# Patient Record
Sex: Male | Born: 1980 | Race: White | Hispanic: No | Marital: Married | Smoking: Current every day smoker
Health system: Southern US, Community
[De-identification: ages and names within clinical notes are randomized; demographics above are authoritative.]

## PROBLEM LIST (undated history)

## (undated) DIAGNOSIS — M549 Dorsalgia, unspecified: Secondary | ICD-10-CM

## (undated) HISTORY — PX: OTHER SURGICAL HISTORY: SHX169

---

## 2002-03-01 ENCOUNTER — Encounter: Payer: Self-pay | Admitting: Internal Medicine

## 2002-03-01 ENCOUNTER — Ambulatory Visit (HOSPITAL_COMMUNITY): Admission: RE | Admit: 2002-03-01 | Discharge: 2002-03-01 | Payer: Self-pay | Admitting: Internal Medicine

## 2002-11-08 ENCOUNTER — Ambulatory Visit (HOSPITAL_COMMUNITY): Admission: RE | Admit: 2002-11-08 | Discharge: 2002-11-08 | Payer: Self-pay | Admitting: *Deleted

## 2003-02-10 ENCOUNTER — Emergency Department (HOSPITAL_COMMUNITY): Admission: EM | Admit: 2003-02-10 | Discharge: 2003-02-10 | Payer: Self-pay | Admitting: Emergency Medicine

## 2003-02-20 ENCOUNTER — Ambulatory Visit (HOSPITAL_COMMUNITY): Admission: RE | Admit: 2003-02-20 | Discharge: 2003-02-20 | Payer: Self-pay | Admitting: Internal Medicine

## 2003-09-23 ENCOUNTER — Emergency Department (HOSPITAL_COMMUNITY): Admission: AC | Admit: 2003-09-23 | Discharge: 2003-09-24 | Payer: Self-pay

## 2003-10-03 ENCOUNTER — Ambulatory Visit (HOSPITAL_BASED_OUTPATIENT_CLINIC_OR_DEPARTMENT_OTHER): Admission: RE | Admit: 2003-10-03 | Discharge: 2003-10-03 | Payer: Self-pay | Admitting: Orthopedic Surgery

## 2003-10-10 ENCOUNTER — Encounter: Admission: RE | Admit: 2003-10-10 | Discharge: 2003-10-10 | Payer: Self-pay | Admitting: Orthopedic Surgery

## 2003-10-14 ENCOUNTER — Encounter (HOSPITAL_COMMUNITY): Admission: RE | Admit: 2003-10-14 | Discharge: 2003-11-13 | Payer: Self-pay | Admitting: Orthopedic Surgery

## 2003-11-20 ENCOUNTER — Encounter (HOSPITAL_COMMUNITY): Admission: RE | Admit: 2003-11-20 | Discharge: 2003-12-20 | Payer: Self-pay | Admitting: Orthopedic Surgery

## 2004-03-03 ENCOUNTER — Emergency Department (HOSPITAL_COMMUNITY): Admission: EM | Admit: 2004-03-03 | Discharge: 2004-03-03 | Payer: Self-pay | Admitting: *Deleted

## 2004-03-09 ENCOUNTER — Emergency Department (HOSPITAL_COMMUNITY): Admission: EM | Admit: 2004-03-09 | Discharge: 2004-03-09 | Payer: Self-pay | Admitting: *Deleted

## 2004-05-07 ENCOUNTER — Emergency Department (HOSPITAL_COMMUNITY): Admission: EM | Admit: 2004-05-07 | Discharge: 2004-05-07 | Payer: Self-pay | Admitting: Emergency Medicine

## 2004-08-23 ENCOUNTER — Emergency Department (HOSPITAL_COMMUNITY): Admission: EM | Admit: 2004-08-23 | Discharge: 2004-08-23 | Payer: Self-pay | Admitting: Emergency Medicine

## 2005-04-18 ENCOUNTER — Emergency Department (HOSPITAL_COMMUNITY): Admission: EM | Admit: 2005-04-18 | Discharge: 2005-04-19 | Payer: Self-pay | Admitting: Emergency Medicine

## 2005-06-07 ENCOUNTER — Emergency Department (HOSPITAL_COMMUNITY): Admission: EM | Admit: 2005-06-07 | Discharge: 2005-06-08 | Payer: Self-pay | Admitting: Emergency Medicine

## 2005-07-05 ENCOUNTER — Emergency Department (HOSPITAL_COMMUNITY): Admission: EM | Admit: 2005-07-05 | Discharge: 2005-07-05 | Payer: Self-pay | Admitting: Emergency Medicine

## 2007-02-06 ENCOUNTER — Emergency Department (HOSPITAL_COMMUNITY): Admission: EM | Admit: 2007-02-06 | Discharge: 2007-02-06 | Payer: Self-pay | Admitting: Emergency Medicine

## 2007-09-09 ENCOUNTER — Emergency Department (HOSPITAL_COMMUNITY): Admission: EM | Admit: 2007-09-09 | Discharge: 2007-09-09 | Payer: Self-pay | Admitting: Emergency Medicine
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
## 2010-10-07 ENCOUNTER — Emergency Department (HOSPITAL_COMMUNITY): Payer: Self-pay

## 2010-10-07 ENCOUNTER — Encounter: Payer: Self-pay | Admitting: *Deleted

## 2010-10-07 ENCOUNTER — Emergency Department (HOSPITAL_COMMUNITY)
Admission: EM | Admit: 2010-10-07 | Discharge: 2010-10-07 | Disposition: A | Discharge status: Home / Self Care | Payer: Self-pay | Attending: Emergency Medicine | Admitting: Emergency Medicine

## 2010-10-07 DIAGNOSIS — F17200 Nicotine dependence, unspecified, uncomplicated: Secondary | ICD-10-CM | POA: Insufficient documentation

## 2010-10-07 DIAGNOSIS — M25561 Pain in right knee: Secondary | ICD-10-CM

## 2010-10-07 DIAGNOSIS — M25569 Pain in unspecified knee: Secondary | ICD-10-CM | POA: Insufficient documentation

## 2010-10-07 MED ORDER — KETOROLAC TROMETHAMINE 60 MG/2ML IM SOLN
60.0000 mg | Freq: Once | INTRAMUSCULAR | Status: AC
  Administered 2010-10-07: 60 mg via INTRAMUSCULAR
  Filled 2010-10-07: qty 2

## 2010-10-07 MED ORDER — NAPROXEN 500 MG PO TABS: 500.0000 mg | ORAL_TABLET | Freq: Two times a day (BID) | ORAL | Status: AC

## 2010-10-07 NOTE — ED Provider Notes (Signed)
History     CSN: [id number] Arrival date & time: 10/07/2010 12:45 AM  Chief Complaint  Patient presents with  . Knee Pain    (Consider location/radiation/quality/duration/timing/severity/associated sxs/prior treatment) HPI Comments: Pt has hx of R knee pain - acute in onset when he awoke from nap today - states that his pain is constant, mild, worse with movement of the knee,  He has no hx of trauma, no swelling, no redness and no fever, He has hx of fracture of the leg in the past but this was many years.  He has had no meds pta.  Patient is a 30 y.o. male presenting with knee pain. The history is provided by the patient and a significant other.  Knee Pain    History reviewed. No pertinent past medical history.  Past Surgical History  Procedure Date  . Wrist surgery     History reviewed. No pertinent family history.  History  Substance Use Topics  . Smoking status: Current Everyday Smoker -- 0.5 packs/day    Types: Cigarettes  . Smokeless tobacco: Not on file  . Alcohol Use: Yes     occ. use      Review of Systems  Constitutional: Negative for fever.  Gastrointestinal: Negative for nausea and vomiting.  Musculoskeletal: Negative for myalgias, back pain and joint swelling.  Neurological: Negative for weakness and numbness.  Hematological: Does not bruise/bleed easily.    Allergies  Review of patient's allergies indicates no known allergies.  Home Medications   Current Outpatient Rx  Name Route Sig Dispense Refill  . IBUPROFEN 800 MG PO TABS Oral Take 800 mg by mouth every 8 (eight) hours as needed.        BP 137/86  Pulse 77  Temp(Src) 98 F (36.7 C) (Oral)  Resp 18  Ht 6\' 3"  (1.905 m)  Wt 225 lb (102.059 kg)  BMI 28.12 kg/m2  SpO2 99%  Physical Exam  Nursing note and vitals reviewed. Constitutional: He appears well-developed and well-nourished. No distress.  HENT:  Head: Normocephalic and atraumatic.  Mouth/Throat: Oropharynx is clear and moist.  No oropharyngeal exudate.  Eyes: Conjunctivae and EOM are normal. Pupils are equal, round, and reactive to light. Right eye exhibits no discharge. Left eye exhibits no discharge. No scleral icterus.  Neck: Normal range of motion. Neck supple. No JVD present. No thyromegaly present.  Cardiovascular: Normal rate, regular rhythm, normal heart sounds and intact distal pulses.  Exam reveals no gallop and no friction rub.   No murmur heard. Pulmonary/Chest: Effort normal and breath sounds normal. No respiratory distress. He has no wheezes. He has no rales.  Abdominal: Soft. Bowel sounds are normal. He exhibits no distension and no mass. There is no tenderness.  Musculoskeletal: Normal range of motion. He exhibits tenderness (R knee with no palpable ttp but with some pain in the internal lateral knee, no swelling, no rendess, no crepitance.). He exhibits no edema.  Lymphadenopathy:    He has no cervical adenopathy.  Neurological: He is alert. Coordination normal.  Skin: Skin is warm and dry. No rash noted. No erythema.  Psychiatric: He has a normal mood and affect. His behavior is normal.    ED Course  Procedures (including critical care time)  Labs Reviewed - No data to display Dg Knee Complete 4 Views Right  10/07/2010  *RADIOLOGY REPORT*  Clinical Data: Patient woke up with severe right knee pain.  No known injury.  RIGHT KNEE - COMPLETE 4+ VIEW  Comparison: 02/10/2003  Findings:  The right knee appears intact. No evidence of acute fracture or subluxation.  No focal bone lesions.  Bone matrix and cortex appear intact.  No abnormal radiopaque densities in the soft tissues.  No focal cortical destruction.  No significant effusion. No significant change since the prior study.  IMPRESSION: No acute bony abnormalities demonstrated.  Original Report Authenticated By: Marlon Pel, M.D.     No diagnosis found.    MDM  Imaging neg for frx, RICE therapy started, explains pain is in distribution  of the IT band.  Toradol given.  F/u encouraged.        Vida Roller, MD 10/07/10 [phone]

## 2010-10-07 NOTE — ED Notes (Signed)
Woke up yesterday evening at 1900 and with left knee pain, denies any injury

## 2010-12-09 ENCOUNTER — Encounter (HOSPITAL_COMMUNITY): Payer: Self-pay | Admitting: *Deleted

## 2010-12-09 ENCOUNTER — Emergency Department (HOSPITAL_COMMUNITY)
Admission: EM | Admit: 2010-12-09 | Discharge: 2010-12-09 | Disposition: A | Discharge status: Home / Self Care | Payer: Self-pay | Attending: Emergency Medicine | Admitting: Emergency Medicine

## 2010-12-09 DIAGNOSIS — G5600 Carpal tunnel syndrome, unspecified upper limb: Secondary | ICD-10-CM | POA: Insufficient documentation

## 2010-12-09 DIAGNOSIS — F17200 Nicotine dependence, unspecified, uncomplicated: Secondary | ICD-10-CM | POA: Insufficient documentation

## 2010-12-09 DIAGNOSIS — G5602 Carpal tunnel syndrome, left upper limb: Secondary | ICD-10-CM

## 2010-12-09 MED ORDER — PREDNISONE 10 MG PO TABS: ORAL_TABLET | ORAL | Status: AC

## 2010-12-09 MED ORDER — TRAMADOL HCL 50 MG PO TABS: 50.0000 mg | ORAL_TABLET | Freq: Four times a day (QID) | ORAL | Status: AC | PRN

## 2010-12-09 MED ORDER — TRAMADOL HCL 50 MG PO TABS
50.0000 mg | ORAL_TABLET | Freq: Once | ORAL | Status: AC
  Administered 2010-12-09: 50 mg via ORAL
  Filled 2010-12-09: qty 1

## 2010-12-09 MED ORDER — PREDNISONE 20 MG PO TABS
60.0000 mg | ORAL_TABLET | Freq: Once | ORAL | Status: AC
  Administered 2010-12-09: 60 mg via ORAL
  Filled 2010-12-09: qty 3

## 2010-12-09 NOTE — ED Provider Notes (Signed)
History     CSN: [id number] Arrival date & time: 12/09/2010 10:46 AM   First MD Initiated Contact with Patient 12/09/10 1108      Chief Complaint  Patient presents with  . Numbness    left hand    (Consider location/radiation/quality/duration/timing/severity/associated sxs/prior treatment) Patient is a 30 y.o. male presenting with wrist pain. The history is provided by the patient.  Wrist Pain This is a recurrent problem. The current episode started in the past 7 days. The problem occurs constantly. The problem has been waxing and waning. Pertinent negatives include no abdominal pain, arthralgias, chest pain, congestion, fever, headaches, joint swelling, myalgias, nausea, neck pain, numbness, rash, sore throat or weakness. Associated symptoms comments: He reports numbness in his left 1st,  Second and third fingers which radiates to his mid forearm for 2 days.  Has a history of carpal tunnel of his right wrist,  And feels like the same sensation in the left wrist.  He denies weakness,  Denies  Injury.  Symptoms are worse at night and kept him from sleeping last night.  He works as a Nature conservation officer at [employer].  He is right handed.. Exacerbated by: Certain positiions make worse,  esp flexion of the wrist. He has tried NSAIDs and ice for the symptoms. The treatment provided no relief.    History reviewed. No pertinent past medical history.  Past Surgical History  Procedure Date  . Wrist surgery     History reviewed. No pertinent family history.  History  Substance Use Topics  . Smoking status: Current Everyday Smoker -- 0.5 packs/day    Types: Cigarettes  . Smokeless tobacco: Not on file  . Alcohol Use: Yes     occ. use      Review of Systems  Constitutional: Negative for fever.  HENT: Negative for congestion, sore throat and neck pain.   Eyes: Negative.   Respiratory: Negative for chest tightness and shortness of breath.   Cardiovascular: Negative for chest pain.    Gastrointestinal: Negative for nausea and abdominal pain.  Genitourinary: Negative.   Musculoskeletal: Negative for myalgias, joint swelling and arthralgias.  Skin: Negative.  Negative for rash and wound.  Neurological: Negative for dizziness, weakness, light-headedness, numbness and headaches.  Hematological: Negative.   Psychiatric/Behavioral: Negative.     Allergies  Review of patient's allergies indicates no known allergies.  Home Medications   Current Outpatient Rx  Name Route Sig Dispense Refill  . IBUPROFEN 800 MG PO TABS Oral Take 800 mg by mouth every 8 (eight) hours as needed.      Marland Kitchen NAPROXEN 500 MG PO TABS Oral Take 1 tablet (500 mg total) by mouth 2 (two) times daily. 30 tablet 0    BP 123/75  Pulse 65  Temp(Src) 97.7 F (36.5 C) (Oral)  Resp 18  Ht 6\' 3"  (1.905 m)  Wt 225 lb (102.059 kg)  BMI 28.12 kg/m2  SpO2 98%  Physical Exam  Nursing note and vitals reviewed. Constitutional: He is oriented to person, place, and time. He appears well-developed and well-nourished.  HENT:  Head: Normocephalic and atraumatic.  Eyes: Conjunctivae are normal.  Neck: Normal range of motion.  Cardiovascular: Normal rate and intact distal pulses.   Pulmonary/Chest: Effort normal.  Abdominal: He exhibits no distension.  Musculoskeletal: Normal range of motion. He exhibits no edema and no tenderness.       Left wrist: He exhibits no swelling, no effusion and no deformity.       Positive Tinels  sign. Full strength and ROM of digits and left wrist.    Neurological: He is alert and oriented to person, place, and time.  Skin: Skin is warm and dry.  Psychiatric: He has a normal mood and affect.    ED Course  Procedures (including critical care time)  Labs Reviewed - No data to display No results found.   No diagnosis found.    MDM  Carpel tunnel.  Velcro splint,  Tramadol,  Prednisone taper.  Referral to Dr Nyoka Cowden, PA 12/09/10 [phone]

## 2010-12-09 NOTE — ED Notes (Signed)
Pt c/o numbness to left arm x 2 days; pt states he thinks it is his carpel tunnel

## 2010-12-09 NOTE — ED Notes (Signed)
Pt a/ox4. resp even and unlabored. NAD at this time. D/C instructions reviewed with pt. Pt verbalized understanding. Pt ambulated to lobby with steady gate.  

## 2010-12-10 NOTE — ED Provider Notes (Signed)
Medical screening examination/treatment/procedure(s) were performed by non-physician practitioner and as supervising physician I was immediately available for consultation/collaboration.  Donnetta Hutching, MD 12/10/10 [phone]

## 2010-12-21 ENCOUNTER — Encounter (HOSPITAL_COMMUNITY): Payer: Self-pay | Admitting: *Deleted

## 2010-12-21 ENCOUNTER — Emergency Department (HOSPITAL_COMMUNITY)
Admission: EM | Admit: 2010-12-21 | Discharge: 2010-12-21 | Disposition: A | Discharge status: Home / Self Care | Payer: Self-pay | Attending: Emergency Medicine | Admitting: Emergency Medicine

## 2010-12-21 DIAGNOSIS — F17200 Nicotine dependence, unspecified, uncomplicated: Secondary | ICD-10-CM | POA: Insufficient documentation

## 2010-12-21 DIAGNOSIS — R509 Fever, unspecified: Secondary | ICD-10-CM | POA: Insufficient documentation

## 2010-12-21 DIAGNOSIS — J111 Influenza due to unidentified influenza virus with other respiratory manifestations: Secondary | ICD-10-CM | POA: Insufficient documentation

## 2010-12-21 MED ORDER — OSELTAMIVIR PHOSPHATE 75 MG PO CAPS: 75.0000 mg | ORAL_CAPSULE | Freq: Two times a day (BID) | ORAL | Status: AC

## 2010-12-21 NOTE — ED Notes (Signed)
Pt took multi symptom cold reliever at 2 pm.

## 2010-12-21 NOTE — ED Provider Notes (Signed)
History     CSN: [id number] Arrival date & time: 12/21/2010  4:58 PM   First MD Initiated Contact with Patient 12/21/10 1719      Chief Complaint  Patient presents with  . Fever    (Consider location/radiation/quality/duration/timing/severity/associated sxs/prior treatment) HPI Comments: Tmax 107.5.  Mild cough.  Child has had flu recently.  Has not had flu vaccine.  Patient is a 30 y.o. male presenting with fever. The history is provided by the patient. No language interpreter was used.  Fever Primary symptoms of the febrile illness include fever, fatigue, headaches, cough, myalgias and arthralgias. This is a new problem.    History reviewed. No pertinent past medical history.  Past Surgical History  Procedure Date  . Wrist surgery     History reviewed. No pertinent family history.  History  Substance Use Topics  . Smoking status: Current Everyday Smoker -- 0.5 packs/day    Types: Cigarettes  . Smokeless tobacco: Not on file  . Alcohol Use: Yes     occ. use      Review of Systems  Constitutional: Positive for fever, chills and fatigue.  HENT: Positive for sore throat.   Respiratory: Positive for cough.   Musculoskeletal: Positive for myalgias and arthralgias.  Neurological: Positive for headaches.  All other systems reviewed and are negative.    Allergies  Review of patient's allergies indicates no known allergies.  Home Medications   Current Outpatient Rx  Name Route Sig Dispense Refill  . IBUPROFEN 800 MG PO TABS Oral Take 800 mg by mouth every 8 (eight) hours as needed.      Marland Kitchen NAPROXEN 500 MG PO TABS Oral Take 1 tablet (500 mg total) by mouth 2 (two) times daily. 30 tablet 0  . PREDNISONE 10 MG PO TABS  Take 6 tabs daily by mouth for 1 day,  Then 5 tabs daily for 1 day,  4 tabs daily for1 day,  3 tabs daily for1 day,  2 tabs daily for1 day,  Then 1 tab daily for 1 day.  21 tablet 0    BP 121/72  Pulse 126  Temp(Src) 100.6 F (38.1 C) (Oral)   Resp 22  Ht 6\' 3"  (1.905 m)  Wt 225 lb (102.059 kg)  BMI 28.12 kg/m2  SpO2 98%  Physical Exam  Nursing note and vitals reviewed. Constitutional: He is oriented to person, place, and time. He appears well-developed and well-nourished. No distress.  HENT:  Head: Normocephalic and atraumatic.  Right Ear: External ear normal.  Nose: Nose normal.  Mouth/Throat: Oropharynx is clear and moist. No oropharyngeal exudate.  Eyes: EOM are normal.  Neck: Normal range of motion.  Cardiovascular: Regular rhythm, normal heart sounds and intact distal pulses.  Exam reveals no gallop and no friction rub.   No murmur heard. Pulmonary/Chest: Effort normal and breath sounds normal. No accessory muscle usage. Not tachypneic. No respiratory distress. He has no decreased breath sounds. He has no wheezes. He has no rhonchi. He has no rales. He exhibits no tenderness.  Abdominal: Soft. He exhibits no distension. There is no tenderness.  Musculoskeletal: Normal range of motion.  Lymphadenopathy:    He has no cervical adenopathy.  Neurological: He is alert and oriented to person, place, and time.  Skin: Skin is warm and dry. He is not diaphoretic.  Psychiatric: He has a normal mood and affect. Judgment normal.    ED Course  Procedures (including critical care time)  Labs Reviewed - No data to display No  results found.   No diagnosis found.    MDM          Worthy Rancher, PA 12/21/10 [phone]

## 2010-12-21 NOTE — ED Notes (Signed)
Pt c/o fever since yesterday. Pt states that it was 107.6 at home. Pt also c/o aching all over. Pt states that he had diarrhea yesterday. Denies nausea and vomiting.

## 2010-12-22 NOTE — ED Provider Notes (Signed)
Medical screening examination/treatment/procedure(s) were performed by non-physician practitioner and as supervising physician I was immediately available for consultation/collaboration.  Nicoletta Dress. Colon Branch, MD 12/22/10 1252

## 2011-02-16 ENCOUNTER — Emergency Department (HOSPITAL_COMMUNITY): Payer: Self-pay

## 2011-02-16 ENCOUNTER — Emergency Department (HOSPITAL_COMMUNITY)
Admission: EM | Admit: 2011-02-16 | Discharge: 2011-02-16 | Disposition: A | Discharge status: Home / Self Care | Payer: Self-pay | Attending: Emergency Medicine | Admitting: Emergency Medicine

## 2011-02-16 ENCOUNTER — Encounter (HOSPITAL_COMMUNITY): Payer: Self-pay | Admitting: Emergency Medicine

## 2011-02-16 DIAGNOSIS — R110 Nausea: Secondary | ICD-10-CM | POA: Insufficient documentation

## 2011-02-16 DIAGNOSIS — R0602 Shortness of breath: Secondary | ICD-10-CM | POA: Insufficient documentation

## 2011-02-16 DIAGNOSIS — J189 Pneumonia, unspecified organism: Secondary | ICD-10-CM | POA: Insufficient documentation

## 2011-02-16 DIAGNOSIS — R05 Cough: Secondary | ICD-10-CM | POA: Insufficient documentation

## 2011-02-16 DIAGNOSIS — R509 Fever, unspecified: Secondary | ICD-10-CM | POA: Insufficient documentation

## 2011-02-16 DIAGNOSIS — R062 Wheezing: Secondary | ICD-10-CM | POA: Insufficient documentation

## 2011-02-16 DIAGNOSIS — IMO0001 Reserved for inherently not codable concepts without codable children: Secondary | ICD-10-CM | POA: Insufficient documentation

## 2011-02-16 DIAGNOSIS — R079 Chest pain, unspecified: Secondary | ICD-10-CM | POA: Insufficient documentation

## 2011-02-16 DIAGNOSIS — R059 Cough, unspecified: Secondary | ICD-10-CM | POA: Insufficient documentation

## 2011-02-16 DIAGNOSIS — R0989 Other specified symptoms and signs involving the circulatory and respiratory systems: Secondary | ICD-10-CM | POA: Insufficient documentation

## 2011-02-16 DIAGNOSIS — R5381 Other malaise: Secondary | ICD-10-CM | POA: Insufficient documentation

## 2011-02-16 MED ORDER — SODIUM CHLORIDE 0.9 % IV BOLUS (SEPSIS)
1000.0000 mL | Freq: Once | INTRAVENOUS | Status: AC
  Administered 2011-02-16: 1000 mL via INTRAVENOUS

## 2011-02-16 MED ORDER — DOXYCYCLINE HYCLATE 100 MG PO CAPS: 100.0000 mg | ORAL_CAPSULE | Freq: Two times a day (BID) | ORAL | Status: AC

## 2011-02-16 MED ORDER — DOXYCYCLINE HYCLATE 100 MG PO TABS
100.0000 mg | ORAL_TABLET | Freq: Once | ORAL | Status: AC
  Administered 2011-02-16: 100 mg via ORAL
  Filled 2011-02-16: qty 1

## 2011-02-16 MED ORDER — OXYCODONE-ACETAMINOPHEN 5-325 MG PO TABS: 1.0000 | ORAL_TABLET | Freq: Four times a day (QID) | ORAL | Status: AC | PRN

## 2011-02-16 MED ORDER — PROMETHAZINE HCL 25 MG PO TABS: 25.0000 mg | ORAL_TABLET | Freq: Four times a day (QID) | ORAL | Status: AC | PRN

## 2011-02-16 MED ORDER — ALBUTEROL SULFATE HFA 108 (90 BASE) MCG/ACT IN AERS
2.0000 | INHALATION_SPRAY | RESPIRATORY_TRACT | Status: DC | PRN
  Administered 2011-02-16: 2 via RESPIRATORY_TRACT
  Filled 2011-02-16: qty 6.7

## 2011-02-16 MED ORDER — OXYCODONE-ACETAMINOPHEN 5-325 MG PO TABS
1.0000 | ORAL_TABLET | Freq: Once | ORAL | Status: AC
Start: 2011-02-16 — End: 2011-02-16
  Administered 2011-02-16: 1 via ORAL
  Filled 2011-02-16: qty 1

## 2011-02-16 MED ORDER — ONDANSETRON HCL 4 MG/2ML IJ SOLN
4.0000 mg | Freq: Once | INTRAMUSCULAR | Status: AC
  Administered 2011-02-16: 4 mg via INTRAVENOUS
  Filled 2011-02-16: qty 2

## 2011-02-16 MED ORDER — MORPHINE SULFATE 4 MG/ML IJ SOLN
4.0000 mg | Freq: Once | INTRAMUSCULAR | Status: AC
  Administered 2011-02-16: 4 mg via INTRAVENOUS
  Filled 2011-02-16: qty 1

## 2011-02-16 NOTE — ED Notes (Signed)
Pt in X ray

## 2011-02-16 NOTE — ED Provider Notes (Signed)
History     CSN: [id number]  Arrival date & time 02/16/11  4098   First MD Initiated Contact with Patient 02/16/11 1300      Chief Complaint  Patient presents with  . Cough  . Fever    (Consider location/radiation/quality/duration/timing/severity/associated sxs/prior treatment) Patient is a 31 y.o. male presenting with cough and fever. The history is provided by the patient.  Cough This is a new problem. Associated symptoms include chest pain, myalgias, shortness of breath and wheezing. Pertinent negatives include no headaches.  Fever Primary symptoms of the febrile illness include fever, fatigue, cough, wheezing, shortness of breath, nausea and myalgias. Primary symptoms do not include headaches, abdominal pain, vomiting, diarrhea or rash.  The myalgias are not associated with weakness.   patient had cough and fevers with chest congestion for last 3 days. 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 He states he aches all over. Some nausea without vomiting. No clear sick contacts. He smokes about half-pack cigarettes a day. No diarrhea. No clear sick contacts. He states he feels weak.  History reviewed. No pertinent past medical history.  Past Surgical History  Procedure Date  . Wrist surgery     History reviewed. No pertinent family history.  History  Substance Use Topics  . Smoking status: Current Everyday Smoker -- 0.5 packs/day    Types: Cigarettes  . Smokeless tobacco: Not on file  . Alcohol Use: Yes     occ. use      Review of Systems  Constitutional: Positive for fever and fatigue. Negative for activity change and appetite change.  HENT: Negative for neck stiffness.   Eyes: Negative for pain.  Respiratory: Positive for cough, shortness of breath and wheezing. Negative for chest tightness.   Cardiovascular: Positive for chest pain. Negative for leg swelling.  Gastrointestinal: Positive for nausea. Negative for vomiting, abdominal pain and diarrhea.  Genitourinary: Negative  for flank pain.  Musculoskeletal: Positive for myalgias. Negative for back pain.  Skin: Negative for rash.  Neurological: Negative for weakness, numbness and headaches.  Psychiatric/Behavioral: Negative for behavioral problems.    Allergies  Review of patient's allergies indicates no known allergies.  Home Medications   Current Outpatient Rx  Name Route Sig Dispense Refill  . DOXYCYCLINE HYCLATE 100 MG PO CAPS Oral Take 1 capsule (100 mg total) by mouth 2 (two) times daily. 20 capsule 0  . IBUPROFEN 800 MG PO TABS Oral Take 800 mg by mouth every 8 (eight) hours as needed.      Marland Kitchen NAPROXEN 500 MG PO TABS Oral Take 1 tablet (500 mg total) by mouth 2 (two) times daily. 30 tablet 0  . OXYCODONE-ACETAMINOPHEN 5-325 MG PO TABS Oral Take 1-2 tablets by mouth every 6 (six) hours as needed for pain. 20 tablet 0  . PREDNISONE 10 MG PO TABS  Take 6 tabs daily by mouth for 1 day,  Then 5 tabs daily for 1 day,  4 tabs daily for1 day,  3 tabs daily for1 day,  2 tabs daily for1 day,  Then 1 tab daily for 1 day.  21 tablet 0  . PROMETHAZINE HCL 25 MG PO TABS Oral Take 1 tablet (25 mg total) by mouth every 6 (six) hours as needed for nausea. 20 tablet 0    BP 102/45  Pulse 100  Temp(Src) 99.7 F (37.6 C) (Oral)  Resp 22  Ht 6\' 3"  (1.905 m)  Wt 220 lb (99.791 kg)  BMI 27.50 kg/m2  SpO2 96%  Physical Exam  Nursing  note and vitals reviewed. Constitutional: He is oriented to person, place, and time. He appears well-developed and well-nourished.       Patient feels febrile. He appears uncomfortable  HENT:  Head: Normocephalic and atraumatic.  Eyes: EOM are normal. Pupils are equal, round, and reactive to light.  Neck: Normal range of motion. Neck supple.  Cardiovascular: Normal rate, regular rhythm and normal heart sounds.   No murmur heard. Pulmonary/Chest: Effort normal. He has wheezes.       Increased wheezes on right side with few rales compared to left.  Abdominal: Soft. Bowel sounds are  normal. He exhibits no distension and no mass. There is no tenderness. There is no rebound and no guarding.  Musculoskeletal: Normal range of motion. He exhibits no edema.  Neurological: He is alert and oriented to person, place, and time. No cranial nerve deficit.  Skin: Skin is warm and dry.  Psychiatric: He has a normal mood and affect.    ED Course  Procedures (including critical care time)  Labs Reviewed - No data to display Dg Chest 2 View  02/16/2011  *RADIOLOGY REPORT*  Clinical Data: Shortness of breath, nausea and vomiting.  CHEST - 2 VIEW  Comparison: None.  Findings: Trachea is midline.  Heart size normal.  Lungs are clear. No pleural fluid.  IMPRESSION: No acute findings.  Original Report Authenticated By: Reyes Ivan, M.D.     1. CAP (community acquired pneumonia)       MDM  Patient with pneumonia symptoms. Localized finding on physical examination. She be treated with antibiotics. On the ER he had some vomiting. Is given a fluid bolus and states he feels somewhat better. Discharge with antibiotics pain medications and antiemetics. He was given doxycycline due to cost.        Juliet Rude. Rubin Payor, MD 02/16/11 1544

## 2011-02-16 NOTE — ED Notes (Signed)
Patient states he does not need anything at this time. Fluid is empty in the IV bag RN Cindy aware.

## 2011-02-16 NOTE — Discharge Instructions (Signed)

## 2011-02-16 NOTE — ED Notes (Signed)
Pt c/o cough/congestion/fever x 3 days

## 2012-08-29 IMAGING — CR DG CHEST 2V
3 series · 3 of 3 positions shown · non-contrast
Comparison: None.

CLINICAL DATA: Shortness of breath, nausea and vomiting.

CHEST - 2 VIEW

[view not recorded (1 of 3)]
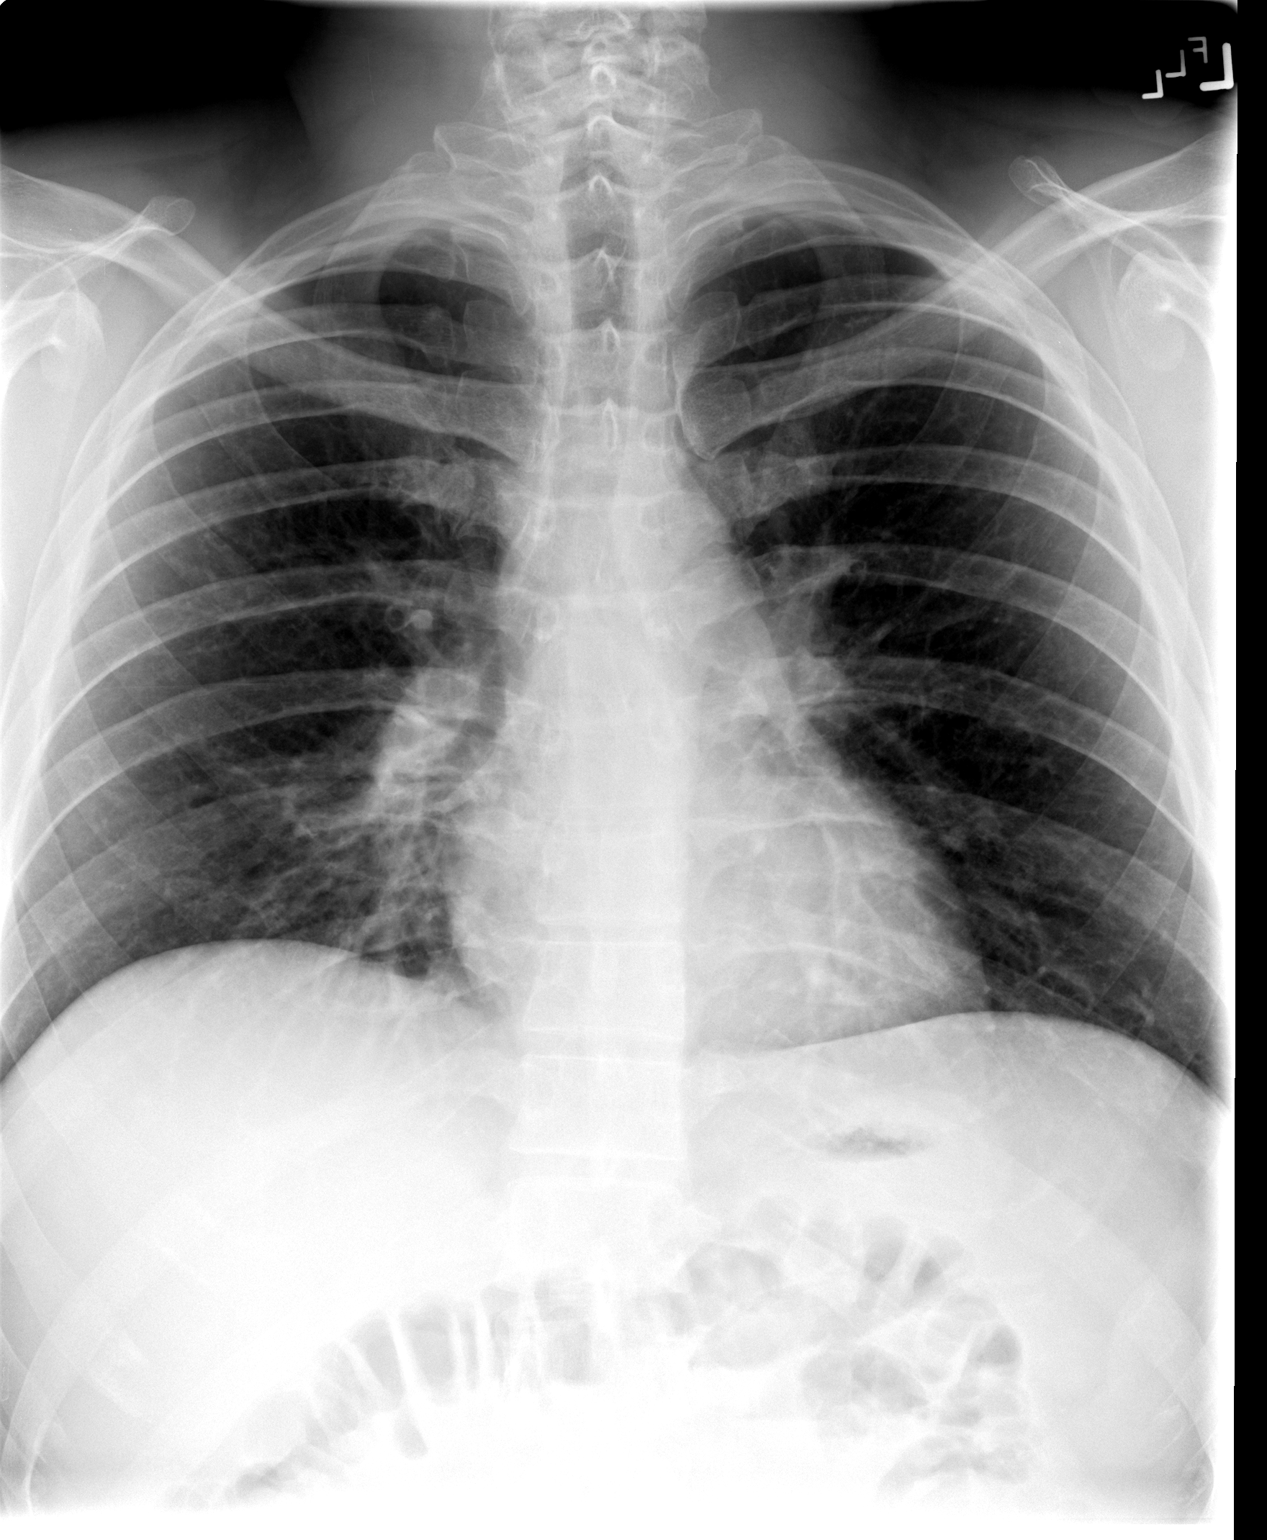

[view not recorded (2 of 3)]
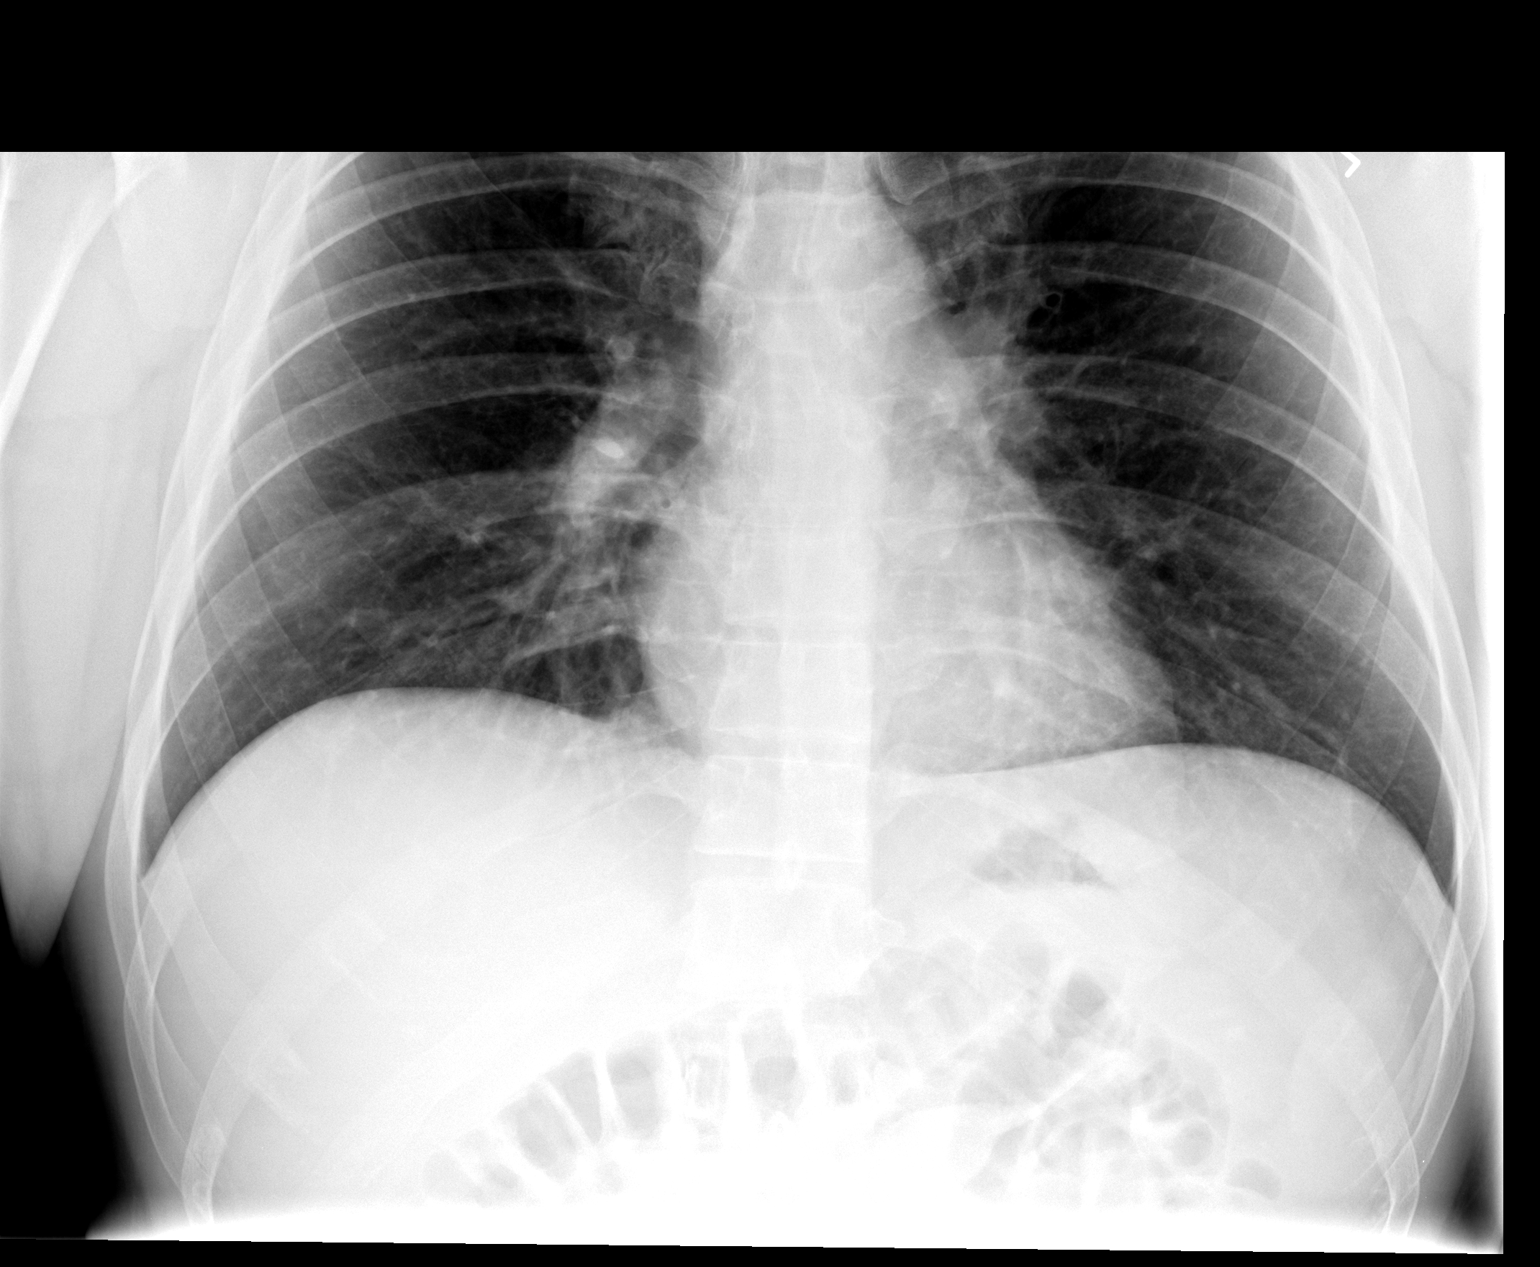

[view not recorded (3 of 3)]
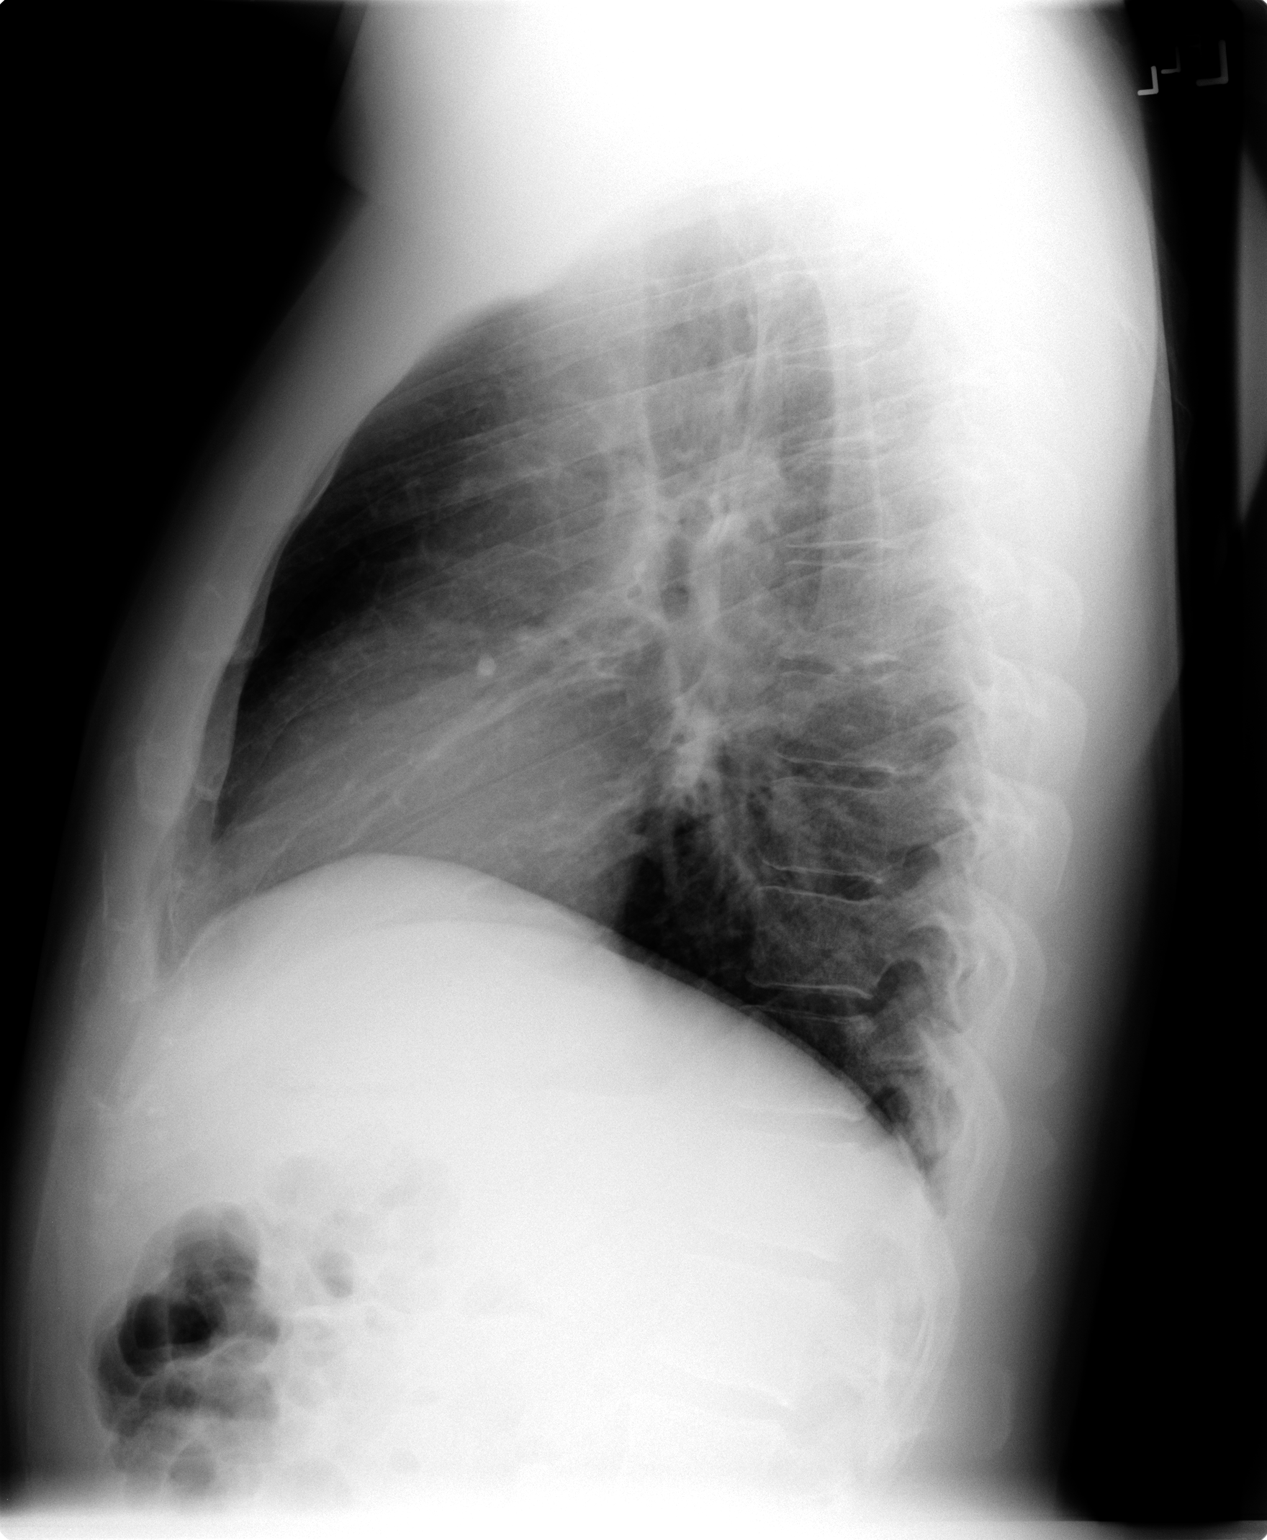

[3 of 3 positions shown; findings below may reference images not displayed]

FINDINGS: Trachea is midline.  Heart size normal.  Lungs are clear.
No pleural fluid.
IMPRESSION: No acute findings.

## 2013-05-04 ENCOUNTER — Encounter (HOSPITAL_COMMUNITY): Payer: Self-pay | Admitting: Emergency Medicine

## 2013-05-04 ENCOUNTER — Emergency Department (HOSPITAL_COMMUNITY)
Admission: EM | Admit: 2013-05-04 | Discharge: 2013-05-04 | Disposition: A | Discharge status: Home / Self Care | Payer: 59 | Attending: Emergency Medicine | Admitting: Emergency Medicine

## 2013-05-04 DIAGNOSIS — M542 Cervicalgia: Secondary | ICD-10-CM | POA: Insufficient documentation

## 2013-05-04 DIAGNOSIS — R599 Enlarged lymph nodes, unspecified: Secondary | ICD-10-CM

## 2013-05-04 DIAGNOSIS — H9209 Otalgia, unspecified ear: Secondary | ICD-10-CM | POA: Insufficient documentation

## 2013-05-04 DIAGNOSIS — Z791 Long term (current) use of non-steroidal anti-inflammatories (NSAID): Secondary | ICD-10-CM | POA: Insufficient documentation

## 2013-05-04 DIAGNOSIS — IMO0002 Reserved for concepts with insufficient information to code with codable children: Secondary | ICD-10-CM | POA: Insufficient documentation

## 2013-05-04 DIAGNOSIS — F17200 Nicotine dependence, unspecified, uncomplicated: Secondary | ICD-10-CM | POA: Insufficient documentation

## 2013-05-04 HISTORY — DX: Dorsalgia, unspecified: M54.9

## 2013-05-04 MED ORDER — AMOXICILLIN 500 MG PO CAPS: 500.0000 mg | ORAL_CAPSULE | Freq: Three times a day (TID) | ORAL | Status: AC

## 2013-05-04 MED ORDER — TRAMADOL HCL 50 MG PO TABS: 50.0000 mg | ORAL_TABLET | Freq: Four times a day (QID) | ORAL | Status: AC | PRN

## 2013-05-04 NOTE — Discharge Instructions (Signed)
Cervical Adenitis You have a swollen lymph gland in your neck. This commonly happens with Strep and virus infections, dental problems, insect bites, and injuries about the face, scalp, or neck. The lymph glands swell as the body fights the infection or heals the injury. Swelling and firmness typically lasts for several weeks after the infection or injury is healed. Rarely lymph glands can become swollen because of cancer or TB. Antibiotics are prescribed if there is evidence of an infection. Sometimes an infected lymph gland becomes filled with pus. This condition may require opening up the abscessed gland by draining it surgically. Most of the time infected glands return to normal within two weeks. Do not poke or squeeze the swollen lymph nodes. That may keep them from shrinking back to their normal size. If the lymph gland is still swollen after 2 weeks, further medical evaluation is needed.  SEEK IMMEDIATE MEDICAL CARE IF:  You have difficulty swallowing or breathing, increased swelling, severe pain, or a high fever.  Document Released: 12/20/2004 Document Revised: 03/14/2011 Document Reviewed: 06/11/2006 [employer] Patient Information 2014 [employer].  

## 2013-05-04 NOTE — ED Provider Notes (Addendum)
CSN: [id number]     Arrival date & time 05/04/13  0726 History  This chart was scribed for Gilda Creasehristopher J. Anakin Varkey, MD by Quintella ReichertMatthew Underwood, ED scribe.  This patient was seen in room [facility] and the patient's care was started at 7:44 AM.   Chief Complaint  Patient presents with  . Neck Pain    The history is provided by the patient. No language interpreter was used.    HPI Comments: John Bray is a 33 y.o. male who presents to the Emergency Department complaining of sudden-onset left frontal neck pain that began 4 days ago.  Pt states he was very congested 4 nights and tried to clear his nasal passages "too hard" and he feels he may have pulled a muscle in that region.  He had sudden onset of pain in his left frontal neck radiating up the left side of his face and into his left ear.  Last night when he lay down in bed his pain suddenly worsened.  Pain is worsened by palpation of his neck.  It is not worsened by swallowing or "sniffling."  He attempted to treat pain with ibuprofen, without relief.  Pt has no congestion or rhinorrhea currently.      Past Medical History  Diagnosis Date  . Back pain     Past Surgical History  Procedure Laterality Date  . Wrist surgery      History reviewed. No pertinent family history.   History  Substance Use Topics  . Smoking status: Current Every Day Smoker -- 0.50 packs/day    Types: Cigarettes  . Smokeless tobacco: Not on file  . Alcohol Use: Yes     Comment: occ. use     Review of Systems  HENT: Positive for ear pain. Negative for congestion, rhinorrhea and trouble swallowing.   Respiratory: Negative for shortness of breath.   Musculoskeletal: Positive for neck pain.  All other systems reviewed and are negative.     Allergies  Review of patient's allergies indicates no known allergies.  Home Medications   Prior to Admission medications   Medication Sig Start Date End Date Taking? Authorizing Provider  ibuprofen  (ADVIL,MOTRIN) 800 MG tablet Take 800 mg by mouth every 8 (eight) hours as needed.      Historical Provider, MD  predniSONE (DELTASONE) 10 MG tablet Take 6 tabs daily by mouth for 1 day,  Then 5 tabs daily for 1 day,  4 tabs daily for1 day,  3 tabs daily for1 day,  2 tabs daily for1 day,  Then 1 tab daily for 1 day.  12/09/10   Burgess AmorJulie Idol, PA-C   BP 139/90  Pulse 92  Temp(Src) 98.2 F (36.8 C) (Oral)  Resp 20  SpO2 100%  Physical Exam  Nursing note and vitals reviewed. Constitutional: He is oriented to person, place, and time. He appears well-developed and well-nourished. No distress.  HENT:  Head: Normocephalic and atraumatic.  Right Ear: Hearing, tympanic membrane and ear canal normal.  Left Ear: Hearing, tympanic membrane and ear canal normal.  Nose: Nose normal.  Mouth/Throat: Oropharynx is clear and moist and mucous membranes are normal.  Eyes: Conjunctivae and EOM are normal. Pupils are equal, round, and reactive to light.  Neck: Normal range of motion. Neck supple.    Tender to left anterior neck  Cardiovascular: Regular rhythm, S1 normal and S2 normal.  Exam reveals no gallop and no friction rub.   No murmur heard. Pulmonary/Chest: Effort normal and breath sounds normal. No  respiratory distress. He exhibits no tenderness.  Abdominal: Soft. Normal appearance and bowel sounds are normal. There is no hepatosplenomegaly. There is no tenderness. There is no rebound, no guarding, no tenderness at McBurney's point and negative Murphy's sign. No hernia.  Musculoskeletal: Normal range of motion.  Lymphadenopathy:    He has cervical adenopathy (left-sided anterior cervical lymphadenopathy).  Neurological: He is alert and oriented to person, place, and time. He has normal strength. No cranial nerve deficit or sensory deficit. Coordination normal. GCS eye subscore is 4. GCS verbal subscore is 5. GCS motor subscore is 6.  Skin: Skin is warm, dry and intact. No rash noted. No cyanosis.   Psychiatric: He has a normal mood and affect. His speech is normal and behavior is normal. Thought content normal.    ED Course  Procedures (including critical care time)  DIAGNOSTIC STUDIES: Oxygen Saturation is 100% on room air, normal by my interpretation.    COORDINATION OF CARE: 7:50 AM-Discussed treatment plan which includes pain medication, allergy medication, antibiotics to cover for possible infection, and f/u with ENT if needed with pt at bedside and pt agreed to plan.     Labs Review Labs Reviewed - No data to display  Imaging Review No results found.   EKG Interpretation None      MDM   Final diagnoses:  Neck pain  Lymph node enlargement    Patient presents with pain and swelling of the left anterior neck region. He has recently had some upper respiratory symptoms. There is a small area that is likely lymphadenopathy present in the region. Oropharyngeal examination was unremarkable. Patient treated empirically with amoxicillin.  I personally performed the services described in this documentation, which was scribed in my presence. The recorded information has been reviewed and is accurate.     Gilda Creasehristopher J. Loy Mccartt, MD 05/05/13 0900  Gilda Creasehristopher J. Imogene Gravelle, MD 05/05/13 [phone]

## 2013-05-04 NOTE — ED Notes (Signed)
C/o pain left side of neck radiating up to face.  Feels like he pulled it.

## 2016-03-11 ENCOUNTER — Emergency Department (HOSPITAL_COMMUNITY)
Admission: EM | Admit: 2016-03-11 | Discharge: 2016-03-11 | Disposition: A | Discharge status: Home / Self Care | Payer: Self-pay | Attending: Emergency Medicine | Admitting: Emergency Medicine

## 2016-03-11 ENCOUNTER — Encounter (HOSPITAL_COMMUNITY): Payer: Self-pay

## 2016-03-11 DIAGNOSIS — S61219A Laceration without foreign body of unspecified finger without damage to nail, initial encounter: Secondary | ICD-10-CM

## 2016-03-11 DIAGNOSIS — Y999 Unspecified external cause status: Secondary | ICD-10-CM | POA: Insufficient documentation

## 2016-03-11 DIAGNOSIS — S61012A Laceration without foreign body of left thumb without damage to nail, initial encounter: Secondary | ICD-10-CM | POA: Insufficient documentation

## 2016-03-11 DIAGNOSIS — S61213A Laceration without foreign body of left middle finger without damage to nail, initial encounter: Secondary | ICD-10-CM | POA: Insufficient documentation

## 2016-03-11 DIAGNOSIS — W260XXA Contact with knife, initial encounter: Secondary | ICD-10-CM | POA: Insufficient documentation

## 2016-03-11 DIAGNOSIS — Y93E5 Activity, floor mopping and cleaning: Secondary | ICD-10-CM | POA: Insufficient documentation

## 2016-03-11 DIAGNOSIS — Z79899 Other long term (current) drug therapy: Secondary | ICD-10-CM | POA: Insufficient documentation

## 2016-03-11 DIAGNOSIS — F17210 Nicotine dependence, cigarettes, uncomplicated: Secondary | ICD-10-CM | POA: Insufficient documentation

## 2016-03-11 DIAGNOSIS — S61215A Laceration without foreign body of left ring finger without damage to nail, initial encounter: Secondary | ICD-10-CM | POA: Insufficient documentation

## 2016-03-11 DIAGNOSIS — Z23 Encounter for immunization: Secondary | ICD-10-CM | POA: Insufficient documentation

## 2016-03-11 DIAGNOSIS — Y929 Unspecified place or not applicable: Secondary | ICD-10-CM | POA: Insufficient documentation

## 2016-03-11 MED ORDER — LIDOCAINE HCL (PF) 2 % IJ SOLN
10.0000 mL | Freq: Once | INTRAMUSCULAR | Status: AC
  Administered 2016-03-11: 10 mL
  Filled 2016-03-11: qty 10

## 2016-03-11 MED ORDER — TETANUS-DIPHTH-ACELL PERTUSSIS 5-2.5-18.5 LF-MCG/0.5 IM SUSP
0.5000 mL | Freq: Once | INTRAMUSCULAR | Status: AC
  Administered 2016-03-11: 0.5 mL via INTRAMUSCULAR
  Filled 2016-03-11: qty 0.5

## 2016-03-11 NOTE — ED Notes (Signed)
Sharpening a knife and sliced his 3, 4 fingers at mp , dp  As well as web space of thenar region. Bleeding controlled, sensation intact.

## 2016-03-11 NOTE — ED Triage Notes (Signed)
Laceration to left hand and fingers today while sharpening a knife. Bleeding controlled in triage.

## 2016-03-11 NOTE — ED Notes (Signed)
KS in for repair

## 2016-03-11 NOTE — ED Provider Notes (Signed)
[facility]-EM[facility]Y DEPT Provider Note   CSN: [id number] Arrival date & time: 03/11/16  1146     History   Chief Complaint Chief Complaint  Patient presents with  . Laceration    HPI John Bray is a 36 y.o. male.  The history is provided by the patient. No language interpreter was used.  Laceration   The incident occurred 1 to 2 hours ago. The laceration is located on the left hand. Size: 1cm 1cm and 2 cm. The laceration mechanism was a a clean knife. The pain is moderate. The pain has been constant since onset. John Bray reports no foreign bodies present. His tetanus status is out of date.    Past Medical History:  Diagnosis Date  . Back pain     There are no active problems to display for this patient.   Past Surgical History:  Procedure Laterality Date  . wrist surgery         Home Medications    Prior to Admission medications   Medication Sig Start Date End Date Taking? Authorizing Provider  amoxicillin (AMOXIL) 500 MG capsule Take 1 capsule (500 mg total) by mouth 3 (three) times daily. 05/04/13   Gilda Crease, MD  ibuprofen (ADVIL,MOTRIN) 800 MG tablet Take 800 mg by mouth every 8 (eight) hours as needed.      Historical Provider, MD  predniSONE (DELTASONE) 10 MG tablet Take 6 tabs daily by mouth for 1 day,  Then 5 tabs daily for 1 day,  4 tabs daily for1 day,  3 tabs daily for1 day,  2 tabs daily for1 day,  Then 1 tab daily for 1 day.  12/09/10   Burgess Amor, PA-C  traMADol (ULTRAM) 50 MG tablet Take 1 tablet (50 mg total) by mouth every 6 (six) hours as needed. 05/04/13   Gilda Crease, MD    Family History No family history on file.  Social History Social History  Substance Use Topics  . Smoking status: Current Every Day Smoker    Packs/day: 0.50    Types: Cigarettes  . Smokeless tobacco: Never Used  . Alcohol use Yes     Comment: occ. use     Allergies   Patient has no known allergies.   Review of Systems Review of Systems  All other  systems reviewed and are negative.    Physical Exam Updated Vital Signs BP 165/86 (BP Location: Left Arm)   Pulse 94   Temp 98.7 F (37.1 C) (Oral)   Resp 17   Ht 6\' 3"  (1.905 m)   Wt 120.2 kg   SpO2 100%   BMI 33.12 kg/m   Physical Exam  Constitutional: John Bray is oriented to person, place, and time. John Bray appears well-developed and well-nourished.  HENT:  Head: Normocephalic.  Eyes: EOM are normal.  Neck: Normal range of motion.  Pulmonary/Chest: Effort normal.  Abdominal: John Bray exhibits no distension.  Musculoskeletal: Normal range of motion.  1cm laceration dorsal ring finger 2cm laceration 3rd finger  1cm laceration base of thumb.   Neurological: John Bray is alert and oriented to person, place, and time.  Psychiatric: John Bray has a normal mood and affect.  Nursing note and vitals reviewed.  LACERATION REPAIR Performed by: Langston Masker Authorized by: Langston Masker Consent: Verbal consent obtained. Risks and benefits: risks, benefits and alternatives were discussed Consent given by: patient Patient identity confirmed: provided demographic data Prepped and Draped in normal sterile fashion Wound explored  Laceration Location: ring finger, thumb and 3rd finger  Laceration Length: ring finger 1cm 3rd finger 2 cm, 1cm thumb  No Foreign Bodies seen or palpated  Anesthesia: local infiltration   Local anesthetic: lidocaine  Anesthetic total: 5 ml  Irrigation method: syringe Amount of cleaning: standard  Skin closure:   Number of sutures: 9  Technique: simple interrupted  Patient tolerance: Patient tolerated the procedure well with no immediate complications.  ED Treatments / Results  Labs (all labs ordered are listed, but only abnormal results are displayed) Labs Reviewed - No data to display  EKG  EKG Interpretation None       Radiology No results found.  Procedures Procedures (including critical care time)  Medications Ordered in ED Medications  lidocaine  (XYLOCAINE) 2 % injection 10 mL (10 mLs Infiltration Given 03/11/16 1214)  Tdap (BOOSTRIX) injection 0.5 mL (0.5 mLs Intramuscular Given 03/11/16 1214)     Initial Impression / Assessment and Plan / ED Course  I have reviewed the triage vital signs and the nursing notes.  Pertinent labs & imaging results that were available during my care of the patient were reviewed by me and considered in my medical decision making (see chart for details).       Final Clinical Impressions(s) / ED Diagnoses   Final diagnoses:  Laceration of finger of left hand without foreign body, nail damage status unspecified, unspecified finger, initial encounter    New Prescriptions Discharge Medication List as of 03/11/2016 12:43 PM    An After Visit Summary was printed and given to the patient. Suture removal in 8 days   Elson AreasLeslie K Rose Hippler, PA-C 03/11/16 1342    Bethann BerkshireJoseph Zammit, MD 03/11/16 [phone]

## 2016-03-11 NOTE — Discharge Instructions (Signed)
Suture removal in 8 days  °
